# Patient Record
Sex: Male | Born: 1980 | Race: Black or African American | Hispanic: No | Marital: Married | Smoking: Never smoker
Health system: Southern US, Community
[De-identification: ages and names within clinical notes are randomized; demographics above are authoritative.]

---

## 2008-01-11 ENCOUNTER — Ambulatory Visit: Payer: Self-pay | Admitting: Family Medicine

## 2008-01-11 DIAGNOSIS — L259 Unspecified contact dermatitis, unspecified cause: Secondary | ICD-10-CM

## 2008-01-11 DIAGNOSIS — B372 Candidiasis of skin and nail: Secondary | ICD-10-CM

## 2008-01-11 DIAGNOSIS — R21 Rash and other nonspecific skin eruption: Secondary | ICD-10-CM | POA: Insufficient documentation

## 2009-11-19 ENCOUNTER — Ambulatory Visit: Payer: Self-pay | Admitting: Family Medicine

## 2009-11-19 DIAGNOSIS — K921 Melena: Secondary | ICD-10-CM | POA: Insufficient documentation

## 2009-11-19 DIAGNOSIS — K219 Gastro-esophageal reflux disease without esophagitis: Secondary | ICD-10-CM

## 2010-02-11 NOTE — Assessment & Plan Note (Signed)
Summary: BLOOD IN STOOL/TJ Room 5   Vital Signs:  Patient Profile:   30 Years Old Male CC:      Bloody stools x 4 days, Diarrhea on Sunday and Monday, Lighter today and constipation Height:     73 inches Weight:      225 pounds O2 Sat:      99 % O2 treatment:    Room Air Temp:     98.5 degrees F oral Pulse rate:   76 / minute Pulse rhythm:   regular Resp:     12  per minute BP sitting:   136 / 84  (left arm) Cuff size:   regular  Vitals Entered By: Emilio Math (November 19, 2009 1:13 PM)                  Current Allergies: ! * POLLENHistory of Present Illness Chief Complaint: Bloody stools x 4 days, Diarrhea on Sunday and Monday, Lighter today and constipation History of Present Illness:  Subjective:  Patient reports that 4 days ago he had a typical bout of mild heartburn which was relieved by two Zantac 75 tabs.  He states that he has occasional indigestion, but does not take Zantac regularly. The next morning he had a normal bowel movement that was followed by a small amount of bright red blood.  There was no associated rectal/anal pain, and no abdominal pain.  The next day his bowel movements were more loose, mixed with blood and mucous.  Yesterday, his bowel movements were more normal, but he still noticed a very light coating of blood.  Today, he had a normal bowel movement, without blood, but he had to strain slightly.  No melena. He states that until the past 4 days, he had had no suspicious changes in his bowel movements.  He has felt well with good appetite and no weight loss.  No recent changes in his diet. Past history negative. No family history of bowel disorders.  REVIEW OF SYSTEMS Constitutional Symptoms      Denies fever, chills, night sweats, weight loss, weight gain, and fatigue.  Eyes       Denies change in vision, eye pain, eye discharge, glasses, contact lenses, and eye surgery. Ear/Nose/Throat/Mouth       Denies hearing loss/aids, change in hearing,  ear pain, ear discharge, dizziness, frequent runny nose, frequent nose bleeds, sinus problems, sore throat, hoarseness, and tooth pain or bleeding.  Respiratory       Denies dry cough, productive cough, wheezing, shortness of breath, asthma, bronchitis, and emphysema/COPD.  Cardiovascular       Denies murmurs, chest pain, and tires easily with exhertion.    Gastrointestinal       Complains of stomach pain, diarrhea, constipation, and blood in bowel movements.      Denies nausea/vomiting and indigestion. Genitourniary       Denies painful urination, kidney stones, and loss of urinary control. Neurological       Denies paralysis, seizures, and fainting/blackouts. Musculoskeletal       Denies muscle pain, joint pain, joint stiffness, decreased range of motion, redness, swelling, muscle weakness, and gout.  Skin       Denies bruising, unusual mles/lumps or sores, and hair/skin or nail changes.  Psych       Denies mood changes, temper/anger issues, anxiety/stress, speech problems, depression, and sleep problems.  Past History:  Past Medical History: Reviewed history from 01/11/2008 and no changes required. Unremarkable  Past Surgical History: Reviewed history  from 01/11/2008 and no changes required. Denies surgical history  Family History: Reviewed history from 01/11/2008 and no changes required. Mother, Healthy Sister, Healthy  Social History: Reviewed history from 01/11/2008 and no changes required. Non-smoker Yes-Etoh No Drugs [employer]   Objective:  Appearance:  Patient appears healthy, stated age, and in no acute distress  Eyes:  Pupils are equal, round, and reactive to light and accomdation.  Extraocular movement is intact.  Conjunctivae are not inflamed.  Mouth/pharynx:  Normal Neck:  Supple.  No adenopathy is present.  No thyromegaly is present Lungs:  Clear to auscultation.  Breath sounds are equal.  Heart:  Regular rate and rhythm without murmurs, rubs, or  gallops.  Abdomen:   There is some very mild peri-umbilical tenderness without masses or hepatosplenomegaly.  Bowel sounds are present.  No CVA or flank tenderness.  Rectal Exam:  Anus is normal without surrounding erythema.  Small nontender external hemorrhoid present.  No lesions are palpated in the rectal vault, although small internal hemorrhoid palpated.  Stool is heme negative.  Prostate is slightly enlarged but symmetric without tenderness or nodules.  Assessment New Problems: Hx of ESOPHAGEAL REFLUX (ICD-530.81) HEMATOCHEZIA (ICD-578.1)   Plan New Orders: Hemoccult Guaiac-1 spec.(in office) [82270] Est. Patient Level IV [32440] Planning Comments:   Will refer to gastroenterologist for further evaluation. Return for follow-up if develops abdominal pain, nausea/vomiting, fever, etc.   The patient and/or caregiver has been counseled thoroughly with regard to medications prescribed including dosage, schedule, interactions, rationale for use, and possible side effects and they verbalize understanding.  Diagnoses and expected course of recovery discussed and will return if not improved as expected or if the condition worsens. Patient and/or caregiver verbalized understanding.   Orders Added: 1)  Hemoccult Guaiac-1 spec.(in office) [82270] 2)  Est. Patient Level IV [10272]

## 2010-02-11 NOTE — Assessment & Plan Note (Signed)
Summary: Referral GI  Refer to [facility]. Donna Christen MD  November 19, 2009 1:57 PM   PT TO SEE DR LONG NOV 29TH 2:15PM

## 2010-02-11 NOTE — Letter (Signed)
Summary: RECORDS RELEASE  RECORDS RELEASE   Imported By: Dannette Barbara 11/19/2009 16:35:11  _____________________________________________________________________  External Attachment:    Type:   Image     Comment:   External Document

## 2010-03-03 ENCOUNTER — Encounter: Payer: Self-pay | Admitting: Family Medicine

## 2010-03-11 NOTE — Letter (Signed)
Summary: [facility] [place]  [facility]    Imported By: Dannette Barbara 03/03/2010 08:28:48  _____________________________________________________________________  External Attachment:    Type:   Image     Comment:   External Document

## 2010-05-18 ENCOUNTER — Encounter: Payer: Self-pay | Admitting: Emergency Medicine

## 2010-05-18 ENCOUNTER — Inpatient Hospital Stay (INDEPENDENT_AMBULATORY_CARE_PROVIDER_SITE_OTHER)
Admission: RE | Admit: 2010-05-18 | Discharge: 2010-05-18 | Disposition: A | Discharge status: Home / Self Care | Payer: BLUE CROSS/BLUE SHIELD | Source: Ambulatory Visit | Attending: Emergency Medicine | Admitting: Emergency Medicine

## 2010-05-18 DIAGNOSIS — J309 Allergic rhinitis, unspecified: Secondary | ICD-10-CM

## 2010-07-09 ENCOUNTER — Encounter: Payer: Self-pay | Admitting: Family Medicine

## 2010-07-09 ENCOUNTER — Inpatient Hospital Stay (INDEPENDENT_AMBULATORY_CARE_PROVIDER_SITE_OTHER)
Admission: RE | Admit: 2010-07-09 | Discharge: 2010-07-09 | Disposition: A | Discharge status: Home / Self Care | Payer: BLUE CROSS/BLUE SHIELD | Source: Ambulatory Visit | Attending: Family Medicine | Admitting: Family Medicine

## 2010-07-09 DIAGNOSIS — L740 Miliaria rubra: Secondary | ICD-10-CM

## 2010-07-10 ENCOUNTER — Telehealth (INDEPENDENT_AMBULATORY_CARE_PROVIDER_SITE_OTHER): Payer: Self-pay | Admitting: Emergency Medicine

## 2010-08-06 ENCOUNTER — Encounter: Payer: Self-pay | Admitting: Family Medicine

## 2010-08-06 ENCOUNTER — Inpatient Hospital Stay (INDEPENDENT_AMBULATORY_CARE_PROVIDER_SITE_OTHER)
Admission: RE | Admit: 2010-08-06 | Discharge: 2010-08-06 | Disposition: A | Discharge status: Home / Self Care | Payer: BLUE CROSS/BLUE SHIELD | Source: Ambulatory Visit | Attending: Family Medicine | Admitting: Family Medicine

## 2010-08-06 DIAGNOSIS — R109 Unspecified abdominal pain: Secondary | ICD-10-CM

## 2010-08-06 DIAGNOSIS — R300 Dysuria: Secondary | ICD-10-CM

## 2010-08-06 LAB — CONVERTED CEMR LAB
Ketones, urine, test strip: NEGATIVE
Protein, U semiquant: NEGATIVE
WBC Urine, dipstick: NEGATIVE
pH: 5.5

## 2010-08-07 ENCOUNTER — Encounter: Payer: Self-pay | Admitting: Family Medicine

## 2010-12-15 NOTE — Progress Notes (Signed)
Summary: Followup Call  Followup call to patient.  Discussed negative GC/chlamydia results. Recommend Ibuprofen and apply ice pack to symphysis pubis.  If pain above the area continues or increases, consider CT scan  Donna Christen MD  August 07, 2010 1:50 PM

## 2010-12-15 NOTE — Progress Notes (Signed)
Summary: PAIN IN LET TESTICLE (rm 2)   Vital Signs:  Patient Profile:   30 Years Old Male CC:      left testicular pain and dysuria x 1 month Height:     73 inches Weight:      214 pounds O2 Sat:      99 % O2 treatment:    Room Air Temp:     98.8 degrees F oral Pulse rate:   83 / minute Resp:     18 per minute BP sitting:   121 / 78  (left arm) Cuff size:   large  Vitals Entered By: Lajean Saver RN (August 06, 2010 3:23 PM)                  Updated Prior Medication List: ZYRTEC ALLERGY 10 MG TABS (CETIRIZINE HCL) prn  Current Allergies (reviewed today): ! * POLLENHistory of Present Illness Chief Complaint: left testicular pain and dysuria x 1 month History of Present Illness:  Subjective:  Patient complains of a long history of vague mild discomfort in his left inguinal area and left testicle, most noticeable when he leans against something.  The discomfort has increased somewhat over the past 3 to 4 days.  No dysuria, frequency, hesitancy, nocturia, or urethral discharge.  No fevers, chills, and sweats.  No back pain.  No abdominal pain.  No nausea or vomiting.  He states that he has begun working out recently, including  abdominal crunches and planks.  REVIEW OF SYSTEMS Constitutional Symptoms      Denies fever, chills, night sweats, weight loss, weight gain, and fatigue.  Eyes       Denies change in vision, eye pain, eye discharge, glasses, contact lenses, and eye surgery. Ear/Nose/Throat/Mouth       Denies hearing loss/aids, change in hearing, ear pain, ear discharge, dizziness, frequent runny nose, frequent nose bleeds, sinus problems, sore throat, hoarseness, and tooth pain or bleeding.  Respiratory       Denies dry cough, productive cough, wheezing, shortness of breath, asthma, bronchitis, and emphysema/COPD.  Cardiovascular       Denies murmurs, chest pain, and tires easily with exhertion.    Gastrointestinal       Complains of stomach pain.      Denies  nausea/vomiting, diarrhea, constipation, blood in bowel movements, and indigestion.      Comments: lower left Genitourniary       Complains of painful urination.      Denies kidney stones and loss of urinary control. Neurological       Denies paralysis, seizures, and fainting/blackouts. Musculoskeletal       Denies muscle pain, joint pain, joint stiffness, decreased range of motion, redness, swelling, muscle weakness, and gout.  Skin       Denies bruising, unusual mles/lumps or sores, and hair/skin or nail changes.  Psych       Denies mood changes, temper/anger issues, anxiety/stress, speech problems, depression, and sleep problems. Other Comments: Patinet c/o dysuria at times, left testicular pain with sitting, and lower left abdominal pain x 1 month   Past History:  Past Medical History: Reviewed history from 01/11/2008 and no changes required. Unremarkable  Past Surgical History: Reviewed history from 01/11/2008 and no changes required. Denies surgical history  Family History: Reviewed history from 01/11/2008 and no changes required. Mother, Healthy Sister, Healthy  Social History: Non-smoker Yes-Etoh No Drugs [employer] Married   Objective:  Appearance:  Patient appears healthy, stated age, and in no acute  distress  Lungs:  Clear to auscultation.  Breath sounds are equal.  Heart:  Regular rate and rhythm without murmurs, rubs, or gallops.  Abdomen:  No masses or hepatosplenomegaly.  Mild tenderness in the left inguinal region more noticeable with valsalva.  No lumps or masses palpated, however.  Bowel sounds are present.  No CVA or flank tenderness.  There is distinct tenderness over the symphysis pubis, worse with resisted adduction of the hips. Genitourinary:  Penis normal without lesions or urethral discharge.  Scrotum is normal.  Testes are descended bilaterally without nodules or tenderness.  No hernias are palpated.  No regional lymphadenopathy palpated.     urinalysis (dipstick):  negative Assessment New Problems: GROIN PAIN (ICD-789.09) DYSURIA (ICD-788.1)  OSTEITIS PUBIS POSSIBLE SMALL LEFT DIRECT INGUINAL HERNIA, BUT DOUBT  Plan New Orders: T-Chlamydia & GC Probe, Urine [[id number]] Urinalysis [CPT-81003] Est. Patient Level IV [16109] Planning Comments:   Will send urine specimen for GC/chlamydia Recommend applying ice pack to symphysis pubis several times daily.  Begin Ibuprofen 200mg , 4 tabs every 8 hours with food for about 4-5 days.  [facility] information and instruction patient handout given on osteitis pubis.  Recommend decreasing abdominal crunches and sit-ups, etc until improved.  Recommend follow-up with [facility] clinic if not improving. If pain worsens in left inguinal area, CT scan would be most useful to evaluate for direct hernia. As a precaution, will send urine specimen for GC/chlamydia.   The patient and/or caregiver has been counseled thoroughly with regard to medications prescribed including dosage, schedule, interactions, rationale for use, and possible side effects and they verbalize understanding.  Diagnoses and expected course of recovery discussed and will return if not improved as expected or if the condition worsens. Patient and/or caregiver verbalized understanding.   Orders Added: 1)  T-Chlamydia & GC Probe, Urine [[id number]] 2)  Urinalysis [CPT-81003] 3)  Est. Patient Level IV [60454]    Laboratory Results   Urine Tests  Date/Time Received: August 06, 2010 3:33 PM  Date/Time Reported: August 06, 2010 3:33 PM   Routine Urinalysis   Color: yellow Appearance: Clear Glucose: negative   (Normal Range: Negative) Bilirubin: negative   (Normal Range: Negative) Ketone: negative   (Normal Range: Negative) Spec. Gravity: 1.015   (Normal Range: 1.003-1.035) Blood: trace-lysed   (Normal Range: Negative) pH: 5.5   (Normal Range: 5.0-8.0) Protein: negative   (Normal Range:  Negative) Urobilinogen: 0.2   (Normal Range: 0-1) Nitrite: negative   (Normal Range: Negative) Leukocyte Esterace: negative   (Normal Range: Negative)

## 2010-12-15 NOTE — Progress Notes (Signed)
Summary: ALLERGIES/WSE(rm2)   Vital Signs:  Patient Profile:   30 Years Old Male CC:      allergies Height:     73 inches Weight:      215 pounds O2 Sat:      96 % O2 treatment:    64Room Air Temp:     98.6 degrees F oral Resp:     18 per minute BP sitting:   126 / 84  (left arm) Cuff size:   large  Vitals Entered By: Linton Flemings RN (May 18, 2010 12:11 PM)                  Updated Prior Medication List: ZYRTEC ALLERGY 10 MG TABS (CETIRIZINE HCL) prn ZANTAC 150 MG TABS (RANITIDINE HCL)  ASTELIN 137 MCG/SPRAY SOLN (AZELASTINE HCL) 1-2 sprays per nostril BID  Current Allergies: ! * POLLENHistory of Present Illness Chief Complaint: allergies History of Present Illness: 30 Years Old Male complains of onset of cold symptoms for a few days.  Peter Rodriguez has been using Zyrtec which is helping a little bit.  He has been spending more time outside lately.  He recently ran out of his Astelin. No sore throat +/- cough No pleuritic pain No wheezing + nasal congestion + post-nasal drainage + sinus pain/pressure No chest congestion + itchy/red eyes No earache No hemoptysis No SOB No chills/sweats No fever No nausea No vomiting No abdominal pain No diarrhea No skin rashes No fatigue No myalgias No headache   REVIEW OF SYSTEMS Constitutional Symptoms      Denies fever, chills, night sweats, weight loss, weight gain, and fatigue.  Eyes       Denies change in vision, eye pain, eye discharge, glasses, contact lenses, and eye surgery. Ear/Nose/Throat/Mouth       Complains of frequent runny nose and sinus problems.      Denies hearing loss/aids, change in hearing, ear pain, ear discharge, dizziness, frequent nose bleeds, sore throat, hoarseness, and tooth pain or bleeding.  Respiratory       Denies dry cough, productive cough, wheezing, shortness of breath, asthma, bronchitis, and emphysema/COPD.  Cardiovascular       Denies murmurs, chest pain, and tires easily with  exhertion.    Gastrointestinal       Denies stomach pain, nausea/vomiting, diarrhea, constipation, blood in bowel movements, and indigestion. Genitourniary       Denies painful urination, kidney stones, and loss of urinary control. Neurological       Denies paralysis, seizures, and fainting/blackouts. Musculoskeletal       Denies muscle pain, joint pain, joint stiffness, decreased range of motion, redness, swelling, muscle weakness, and gout.  Skin       Denies bruising, unusual mles/lumps or sores, and hair/skin or nail changes.  Psych       Denies mood changes, temper/anger issues, anxiety/stress, speech problems, depression, and sleep problems.  Past History:  Past Medical History: Reviewed history from 01/11/2008 and no changes required. Unremarkable  Past Surgical History: Reviewed history from 01/11/2008 and no changes required. Denies surgical history  Family History: Reviewed history from 01/11/2008 and no changes required. Mother, Healthy Sister, Healthy  Social History: Reviewed history from 01/11/2008 and no changes required. Non-smoker Yes-Etoh No Drugs [employer] Physical Exam General appearance: well developed, well nourished, no acute distress Nasal: clear discharge Oral/Pharynx: clear PND, no erythema, no exudates Neck: neck supple,  trachea midline, no masses Chest/Lungs: no rales, wheezes, or rhonchi bilateral, breath sounds equal without  effort Heart: regular rate and  rhythm, no murmur MSE: oriented to time, place, and person Assessment New Problems: ALLERGIC RHINITIS (ICD-477.9)   Plan New Medications/Changes: ASTELIN 137 MCG/SPRAY SOLN (AZELASTINE HCL) 1-2 sprays per nostril BID  #1 x 5, 05/18/2010, Hoyt Koch MD  New Orders: Est. Patient Level III [phone] Pulse Oximetry (single measurment) [94760] Services provided After hours-Weekends-Holidays [99051] Planning Comments:   No antibiotic given today since likely allergic.   Gave Rx for Astelin.  Use Zyrtec but add Sudafed for a few days. Use nasal saline solution (over the counter) at least 3 times a day. Follow up with your primary doctor  if no improvement in 5-7 days, sooner if increasing pain, fever, or new symptoms.  If his symptoms get worse, fever, facial pain... patient to call back in 5-7 days and we'll call in Zpak.   The patient and/or caregiver has been counseled thoroughly with regard to medications prescribed including dosage, schedule, interactions, rationale for use, and possible side effects and they verbalize understanding.  Diagnoses and expected course of recovery discussed and will return if not improved as expected or if the condition worsens. Patient and/or caregiver verbalized understanding.  Prescriptions: ASTELIN 137 MCG/SPRAY SOLN (AZELASTINE HCL) 1-2 sprays per nostril BID  #1 x 5   Entered and Authorized by:   Hoyt Koch MD   Signed by:   Hoyt Koch MD on 05/18/2010   Method used:   Print then Give to Patient   RxID:   [id number]   Orders Added: 1)  Est. Patient Level III [86578] 2)  Pulse Oximetry (single measurment) [94760] 3)  Services provided After hours-Weekends-Holidays [99051]

## 2010-12-15 NOTE — Letter (Signed)
Summary: Out of Work  [facility]  [address]   [place], [place] [address]   Phone: [phone]  Fax: [phone]    July 09, 2010   Employee:  [employer]    To Whom It May Concern:   For Medical reasons, please excuse the above named employee from work today and tomorrow.   If you need additional information, please feel free to contact our office.         Sincerely,    Donna Christen MD  Appended Document: Out of Work Above letter written in error.

## 2010-12-15 NOTE — Telephone Encounter (Signed)
  Phone Note Outgoing Call Call back at [facility] Phone [phone] [facility]     Call placed by: Emilio Math,  July 10, 2010 2:04 PM Call placed to: Patient Summary of Call: Left message hope rash is resolving call with any questions or concerns

## 2010-12-15 NOTE — Progress Notes (Signed)
Summary: HEAT RASH ON BOTH ARMS Room 4   Vital Signs:  Patient Profile:   30 Years Old Male CC:      Rash on both arms x 2 wkks after sitting in the sun Height:     73 inches Weight:      213 pounds O2 Sat:      99 % O2 treatment:    Room Air Temp:     98.0 degrees F oral Pulse rate:   79 / minute Pulse rhythm:   regular Resp:     12 per minute BP sitting:   132 / 81  (left arm) Cuff size:   regular  Vitals Entered By: Emilio Math (July 09, 2010 1:21 PM)                  Current Allergies (reviewed today): ! * POLLENHistory of Present Illness Chief Complaint: Rash on both arms x 2 wkks after sitting in the sun History of Present Illness:  Subjective:  Patient complains of onset of pruritic rash on both forearms two weeks ago after he had been sitting in sun at a swimming pool.  The rash consisted of numerous tiny blisters, and improved after applying 1% HC cream.  Three days ago after spending several hours in the sun washing cars, he developed a similar pruritic eruption on his forearms, now improved.    Current Meds ZYRTEC ALLERGY 10 MG TABS (CETIRIZINE HCL) prn CORTIZONE-10 1 % OINT (HYDROCORTISONE)  TRIAMCINOLONE ACETONIDE 0.1 % LOTN (TRIAMCINOLONE ACETONIDE) Apply thin film to affected area once or twice daily  REVIEW OF SYSTEMS Constitutional Symptoms      Denies fever, chills, night sweats, weight loss, weight gain, and fatigue.  Eyes       Denies change in vision, eye pain, eye discharge, glasses, contact lenses, and eye surgery. Ear/Nose/Throat/Mouth       Denies hearing loss/aids, change in hearing, ear pain, ear discharge, dizziness, frequent runny nose, frequent nose bleeds, sinus problems, sore throat, hoarseness, and tooth pain or bleeding.  Respiratory       Denies dry cough, productive cough, wheezing, shortness of breath, asthma, bronchitis, and emphysema/COPD.  Cardiovascular       Denies murmurs, chest pain, and tires easily with exhertion.     Gastrointestinal       Denies stomach pain, nausea/vomiting, diarrhea, constipation, blood in bowel movements, and indigestion. Genitourniary       Denies painful urination, kidney stones, and loss of urinary control. Neurological       Denies paralysis, seizures, and fainting/blackouts. Musculoskeletal       Denies muscle pain, joint pain, joint stiffness, decreased range of motion, redness, swelling, muscle weakness, and gout.  Skin       Denies bruising, unusual mles/lumps or sores, and hair/skin or nail changes.  Psych       Denies mood changes, temper/anger issues, anxiety/stress, speech problems, depression, and sleep problems.  Past History:  Past Medical History: Reviewed history from 01/11/2008 and no changes required. Unremarkable  Past Surgical History: Reviewed history from 01/11/2008 and no changes required. Denies surgical history  Family History: Reviewed history from 01/11/2008 and no changes required. Mother, Healthy Sister, Healthy  Social History: Reviewed history from 01/11/2008 and no changes required. Non-smoker Yes-Etoh No Drugs [employer]   Objective:  Appearance:  Patient appears healthy, stated age, and in no acute distress  Skin:  On both dorsal forearms the skin is slightly hyperkeratotic but no distinct dermatitis is present.  Assessment New Problems: HEAT RASH (ICD-705.1)  SUSPECT HEAT RASH FROM SUN EXPOSURE  Plan New Medications/Changes: TRIAMCINOLONE ACETONIDE 0.1 % LOTN (TRIAMCINOLONE ACETONIDE) Apply thin film to affected area once or twice daily  #60cc x 0, 07/09/2010, Donna Christen MD  New Orders: Est. Patient Level III [phone] Planning Comments:   Recommend wearing long sleeves when in sun for prolonged period.  Use calamine lotion for itching.  Rx for triamcinolone lotion for more severe rash.  [facility] info sheet given on heat rash.   The patient and/or caregiver has been counseled thoroughly with regard to  medications prescribed including dosage, schedule, interactions, rationale for use, and possible side effects and they verbalize understanding.  Diagnoses and expected course of recovery discussed and will return if not improved as expected or if the condition worsens. Patient and/or caregiver verbalized understanding.  Prescriptions: TRIAMCINOLONE ACETONIDE 0.1 % LOTN (TRIAMCINOLONE ACETONIDE) Apply thin film to affected area once or twice daily  #60cc x 0   Entered and Authorized by:   Donna Christen MD   Signed by:   Donna Christen MD on 07/09/2010   Method used:   Print then Give to Patient   RxID:   [id number]   Orders Added: 1)  Est. Patient Level III [47425]

## 2012-10-22 ENCOUNTER — Encounter: Payer: Self-pay | Admitting: Emergency Medicine

## 2012-10-22 ENCOUNTER — Emergency Department (INDEPENDENT_AMBULATORY_CARE_PROVIDER_SITE_OTHER)
Admission: EM | Admit: 2012-10-22 | Discharge: 2012-10-22 | Disposition: A | Discharge status: Home / Self Care | Payer: 59 | Source: Home / Self Care | Attending: Family Medicine | Admitting: Family Medicine

## 2012-10-22 ENCOUNTER — Emergency Department (INDEPENDENT_AMBULATORY_CARE_PROVIDER_SITE_OTHER): Payer: 59

## 2012-10-22 DIAGNOSIS — M5430 Sciatica, unspecified side: Secondary | ICD-10-CM

## 2012-10-22 DIAGNOSIS — M5431 Sciatica, right side: Secondary | ICD-10-CM

## 2012-10-22 DIAGNOSIS — M5137 Other intervertebral disc degeneration, lumbosacral region: Secondary | ICD-10-CM

## 2012-10-22 DIAGNOSIS — M4310 Spondylolisthesis, site unspecified: Secondary | ICD-10-CM

## 2012-10-22 MED ORDER — PREDNISONE 50 MG PO TABS: ORAL_TABLET | ORAL | Status: DC

## 2012-10-22 MED ORDER — CYCLOBENZAPRINE HCL 10 MG PO TABS: 10.0000 mg | ORAL_TABLET | Freq: Three times a day (TID) | ORAL | Status: DC | PRN

## 2012-10-22 NOTE — ED Notes (Signed)
Reports jumping and stretching overhead 3 days ago; awoke with numbness/pain/tingling in right foot which radiated up leg, into buttocks and lower back. Has used Aleve without relief. Has had back sprains in past, but never required medical care.

## 2012-10-22 NOTE — ED Provider Notes (Signed)
CSN: [id number]     Arrival date & time 10/22/12  1412 History   First MD Initiated Contact with Patient 10/22/12 1415     Chief Complaint  Patient presents with  . Back Pain    HPI  The patient presents today with back pain. Location: Lumbar spine, R sided  Timing: intermittnent  Description: Was jumping with daughter last week, felt a mild back strain. Woke up next am with with LBP and burning radiating down into R leg and R foot.  Worse with: movement  Better with: rest  Trauma: jumping  Bladder/bowel incontinence: n Weakness: no Fever/chills: no Night pain:no Unexplained weight loss: no Cancer/immunosuppression: no PMH of osteoporosis or chronic steroid use:  no   History reviewed. No pertinent past medical history. History reviewed. No pertinent past surgical history. History reviewed. No pertinent family history. History  Substance Use Topics  . Smoking status: Never Smoker   . Smokeless tobacco: Not on file  . Alcohol Use: Yes    Review of Systems  All other systems reviewed and are negative.    Allergies  Review of patient's allergies indicates no known allergies.  Home Medications   Current Outpatient Rx  Name  Route  Sig  Dispense  Refill  . cetirizine (ZYRTEC) 10 MG tablet   Oral   Take 10 mg by mouth daily.         . cyclobenzaprine (FLEXERIL) 10 MG tablet   Oral   Take 1 tablet (10 mg total) by mouth 3 (three) times daily as needed for muscle spasms.   30 tablet   0   . predniSONE (DELTASONE) 50 MG tablet      1 tab daily x 5 days   7 tablet   0    BP 116/78  Temp(Src) 97.8 F (36.6 C) (Oral)  Resp 16  Ht 6' (1.829 m)  Wt 204 lb (92.534 kg)  BMI 27.66 kg/m2  SpO2 98% Physical Exam  Constitutional: He appears well-developed and well-nourished.  HENT:  Head: Normocephalic and atraumatic.  Eyes: Conjunctivae are normal. Pupils are equal, round, and reactive to light.  Neck: Normal range of motion. Neck supple.  Cardiovascular:  Normal rate and regular rhythm.   Pulmonary/Chest: Effort normal and breath sounds normal.  Abdominal: Soft.  Musculoskeletal:       Arms: Neurological: He is alert.  Skin: Skin is warm.    ED Course  Procedures (including critical care time) Labs Review Labs Reviewed - No data to display Imaging Review Dg Lumbar Spine Complete  10/22/2012   CLINICAL DATA:  32 year old male with low back injury and pain.  EXAM: LUMBAR SPINE - COMPLETE 4+ VIEW  COMPARISON:  None.  FINDINGS: Five non rib-bearing lumbar type vertebra are identified.  5 mm retrolisthesis of L5 on S1 is noted with mild degenerative disc disease at this level.  There is no evidence of acute fracture.  The other lumbar disk spaces are maintained.  No focal bony lesions or spondylolysis noted.  IMPRESSION: Mild degenerative disc disease and 5 mm spondylolisthesis at L5-S1.  No evidence of acute fracture.   Electronically Signed   By: Laveda Abbe M.D.   On: 10/22/2012 15:25      MDM   1. Sciatica, right    Exam consistent with sciatica  L4-L5 vs L5-S1 distribution given exam. Xray somewhat correlates this.  Prednisone for acute treatment.  RICE Discussed general and MSK red flags.  Follow up with sports medicine.  The patient and/or caregiver has been counseled thoroughly with regard to treatment plan and/or medications prescribed including dosage, schedule, interactions, rationale for use, and possible side effects and they verbalize understanding. Diagnoses and expected course of recovery discussed and will return if not improved as expected or if the condition worsens. Patient and/or caregiver verbalized understanding.         Doree Albee, MD 10/22/12 [phone]

## 2012-11-04 ENCOUNTER — Ambulatory Visit (INDEPENDENT_AMBULATORY_CARE_PROVIDER_SITE_OTHER): Payer: 59 | Admitting: Sports Medicine

## 2012-11-04 ENCOUNTER — Encounter: Payer: Self-pay | Admitting: Sports Medicine

## 2012-11-04 VITALS — BP 131/79 | HR 87 | Wt 211.0 lb

## 2012-11-04 DIAGNOSIS — M5416 Radiculopathy, lumbar region: Secondary | ICD-10-CM | POA: Insufficient documentation

## 2012-11-04 DIAGNOSIS — IMO0002 Reserved for concepts with insufficient information to code with codable children: Secondary | ICD-10-CM

## 2012-11-04 MED ORDER — MELOXICAM 15 MG PO TABS: ORAL_TABLET | ORAL | Status: DC

## 2012-11-04 NOTE — Progress Notes (Signed)
  Subjective:    CC: Back pain  HPI: This is a pleasant 32 year old male who presents with three weeks of right-sided low back pain that began while he was jumping. The pain is moderate and persistent. It is worse with prolonged sitting and improves when he stands up. It does not impair his sleep. He describes a burning pain that radiates down into the buttock and down the back of the leg to the lateral foot. He was seen in urgent care two weeks ago and prescribed flexeril and a 5-day course of prednisone. The pain improved slightly with prednisone but he had many headaches. He denies any loss of bowel or bladder function or muscle weakness.  Past medical history, Surgical history, Family history not pertinant except as noted below, Social history, Allergies, and medications have been entered into the medical record, reviewed, and no changes needed.   Review of Systems: No headache, visual changes, nausea, vomiting, diarrhea, constipation, dizziness, abdominal pain, skin rash, fevers, chills, night sweats, swollen lymph nodes, weight loss, chest pain, body aches, joint swelling, muscle aches, shortness of breath, mood changes, visual or auditory hallucinations.  Objective:    General: Well Developed, well nourished, and in no acute distress.  Neuro: Alert and oriented x3, extra-ocular muscles intact, sensation grossly intact.  HEENT: Normocephalic, atraumatic, pupils equal round reactive to light, neck supple, no masses, no lymphadenopathy, thyroid nonpalpable.  Skin: Warm and dry, no rashes noted.  Cardiac: Regular rate and rhythm, no murmurs rubs or gallops.  Respiratory: Clear to auscultation bilaterally. Not using accessory muscles, speaking in full sentences.  Abdominal: Soft, nontender, nondistended, positive bowel sounds, no masses, no organomegaly.  Back Exam:  Inspection: Unremarkable  Motion: Flexion 45 deg, Extension 45 deg, Side Bending to 45 deg bilaterally,  Rotation to 45 deg  bilaterally  SLR laying: Positive on R side  XSLR laying: Negative  Palpable tenderness: Tender to palpation to the right of his low lumbar FABER: negative. Sensory change: Gross sensation intact to all lumbar and sacral dermatomes.  Reflexes: 2+ at both patellar tendons, 2+ at L achilles tendon, 1+ at R achilles tendon Strength at foot  Plantar-flexion: 5/5 Dorsi-flexion: 5/5 Eversion: 5/5 Inversion: 5/5  Leg strength  Quad: 5/5 Hamstring: 5/5 Hip flexor: 5/5 Hip abductors: 5/5  Gait unremarkable.  X-rays were personally reviewed and do show mild retrolisthesis of L5 on S1 with degenerative disc changes.  Impression and Recommendations:    The patient was counselled, risk factors were discussed, anticipatory guidance given.  Assessment: This is a 32 year old male whose back and leg pain are likely L5 vs S1 radiculopathy.  Plan:  1. 4 weeks of formal physical therapy 2. Meloxicam as needed for pain 3. Return for follow-up in 4 weeks  This note was originally written by Karin Lieu MS3.

## 2012-11-04 NOTE — Assessment & Plan Note (Signed)
Mobic, PT, return in one month, MRI for interventional injection planning if no better.

## 2012-11-16 ENCOUNTER — Ambulatory Visit: Payer: 59 | Admitting: Physical Therapy

## 2012-11-16 DIAGNOSIS — M6281 Muscle weakness (generalized): Secondary | ICD-10-CM

## 2012-11-16 DIAGNOSIS — M545 Low back pain: Secondary | ICD-10-CM

## 2012-11-16 DIAGNOSIS — IMO0002 Reserved for concepts with insufficient information to code with codable children: Secondary | ICD-10-CM

## 2012-11-16 DIAGNOSIS — M2560 Stiffness of unspecified joint, not elsewhere classified: Secondary | ICD-10-CM

## 2012-11-23 ENCOUNTER — Encounter: Payer: 59 | Admitting: Physical Therapy

## 2012-11-23 DIAGNOSIS — IMO0002 Reserved for concepts with insufficient information to code with codable children: Secondary | ICD-10-CM

## 2012-11-23 DIAGNOSIS — M6281 Muscle weakness (generalized): Secondary | ICD-10-CM

## 2012-11-23 DIAGNOSIS — M2560 Stiffness of unspecified joint, not elsewhere classified: Secondary | ICD-10-CM

## 2012-11-23 DIAGNOSIS — M545 Low back pain: Secondary | ICD-10-CM

## 2012-11-25 ENCOUNTER — Encounter: Payer: 59 | Admitting: Physical Therapy

## 2012-11-25 DIAGNOSIS — M6281 Muscle weakness (generalized): Secondary | ICD-10-CM

## 2012-11-25 DIAGNOSIS — M2560 Stiffness of unspecified joint, not elsewhere classified: Secondary | ICD-10-CM

## 2012-11-25 DIAGNOSIS — IMO0002 Reserved for concepts with insufficient information to code with codable children: Secondary | ICD-10-CM

## 2012-11-28 ENCOUNTER — Encounter: Payer: 59 | Admitting: Physical Therapy

## 2012-11-28 DIAGNOSIS — M545 Low back pain: Secondary | ICD-10-CM

## 2012-11-28 DIAGNOSIS — M2560 Stiffness of unspecified joint, not elsewhere classified: Secondary | ICD-10-CM

## 2012-11-28 DIAGNOSIS — M6281 Muscle weakness (generalized): Secondary | ICD-10-CM

## 2012-11-28 DIAGNOSIS — IMO0002 Reserved for concepts with insufficient information to code with codable children: Secondary | ICD-10-CM

## 2012-11-30 ENCOUNTER — Encounter: Payer: 59 | Admitting: Physical Therapy

## 2012-11-30 DIAGNOSIS — M6281 Muscle weakness (generalized): Secondary | ICD-10-CM

## 2012-11-30 DIAGNOSIS — IMO0002 Reserved for concepts with insufficient information to code with codable children: Secondary | ICD-10-CM

## 2012-12-05 ENCOUNTER — Encounter: Payer: 59 | Admitting: Physical Therapy

## 2012-12-07 ENCOUNTER — Telehealth: Payer: Self-pay | Admitting: *Deleted

## 2012-12-07 ENCOUNTER — Encounter: Payer: Self-pay | Admitting: Sports Medicine

## 2012-12-07 ENCOUNTER — Ambulatory Visit (INDEPENDENT_AMBULATORY_CARE_PROVIDER_SITE_OTHER): Payer: 59 | Admitting: Sports Medicine

## 2012-12-07 VITALS — BP 125/76 | HR 73 | Wt 209.0 lb

## 2012-12-07 DIAGNOSIS — M5416 Radiculopathy, lumbar region: Secondary | ICD-10-CM

## 2012-12-07 DIAGNOSIS — IMO0002 Reserved for concepts with insufficient information to code with codable children: Secondary | ICD-10-CM

## 2012-12-07 NOTE — Telephone Encounter (Signed)
PA obtained for MRI Lumbar Spine w/o contrast.  Auth # [id number].  Meyer Cory, LPN

## 2012-12-07 NOTE — Assessment & Plan Note (Signed)
Excellent response to conservative measures. Traction seemed to work the best. At this point he does want to proceed with MRI for interventional injection planning. Like to see him back to go over results of the MRI.

## 2012-12-07 NOTE — Progress Notes (Signed)
Patient ID: Jurell Basista, male   DOB: 01-30-1980, 32 y.o.   MRN: [id number]  Subjective:    CC: Follow up (herniated disc)  HPI: This is a 32 y.o patient who is here for follow up for a herniated disc. Last time he was seen in clinic about a month ago, we started him with conservative methods (Physical Therapy and Mobic). He does report some improvement since starting PT. The patient states that the numbness and tingling sensation on his right lower leg and lateral foot are gone but he still has a pain in his buttock up to his knee. The pain is worse when sitting down for a long time or during long car rides. Our plan last time was to try conservative methods and if the symptoms persist, we will get an MRI in anticipation of epidural. The patient opts for this intervention at this time.   Past medical history, Surgical history, Family history not pertinant except as noted below, Social history, Allergies, and medications have been entered into the medical record, reviewed, and no changes needed.   Review of Systems: No fevers, chills, night sweats, weight loss, chest pain, or shortness of breath.   Objective:    General: Well Developed, well nourished, and in no acute distress.  Neuro: Alert and oriented x3, extra-ocular muscles intact, sensation grossly intact.  HEENT: Normocephalic, atraumatic, pupils equal round reactive to light, neck supple, no masses, no lymphadenopathy, thyroid nonpalpable.  Skin: Warm and dry, no rashes. Cardiac: Regular rate and rhythm, no murmurs rubs or gallops, no lower extremity edema.  Respiratory: Clear to auscultation bilaterally. Not using accessory muscles, speaking in full sentences. Back Exam:  Inspection: Unremarkable  Motion: Flexion 45 deg, Extension 45 deg, Side Bending to 45 deg bilaterally,  Rotation to 45 deg bilaterally  SLR laying: Negative  XSLR laying: Negative  Palpable tenderness: None. FABER: negative. Sensory change: Gross sensation intact  to all lumbar and sacral dermatomes.  Reflexes: 2+ at both patellar tendons, 2+ at achilles tendons, Babinski's downgoing.  Strength at foot  Plantar-flexion: 5/5 Dorsi-flexion: 5/5 Eversion: 5/5 Inversion: 5/5  Leg strength  Quad: 5/5 Hamstring: 5/5 Hip flexor: 5/5 Hip abductors: 5/5  Gait unremarkable.  Impression and Recommendations:   Assessment: This is a 32 y.o patient who is here for follow up for a herniated disc possibly in an L5 vs S1 distribution.  Plan:  -At time, we will schedule an MRI in anticipation of getting an epidural. -The patient will make a follow up appointment to go over MRI results with physician.  Patient encouraged to continue home exercises.

## 2012-12-10 ENCOUNTER — Ambulatory Visit (HOSPITAL_BASED_OUTPATIENT_CLINIC_OR_DEPARTMENT_OTHER)
Admission: RE | Admit: 2012-12-10 | Discharge: 2012-12-10 | Disposition: A | Discharge status: Home / Self Care | Payer: 59 | Source: Ambulatory Visit | Attending: Sports Medicine | Admitting: Sports Medicine

## 2012-12-10 DIAGNOSIS — M545 Low back pain, unspecified: Secondary | ICD-10-CM | POA: Insufficient documentation

## 2012-12-10 DIAGNOSIS — M5137 Other intervertebral disc degeneration, lumbosacral region: Secondary | ICD-10-CM | POA: Insufficient documentation

## 2012-12-10 DIAGNOSIS — M5126 Other intervertebral disc displacement, lumbar region: Secondary | ICD-10-CM | POA: Insufficient documentation

## 2012-12-10 DIAGNOSIS — M51379 Other intervertebral disc degeneration, lumbosacral region without mention of lumbar back pain or lower extremity pain: Secondary | ICD-10-CM | POA: Insufficient documentation

## 2012-12-10 DIAGNOSIS — M47817 Spondylosis without myelopathy or radiculopathy, lumbosacral region: Secondary | ICD-10-CM | POA: Insufficient documentation

## 2012-12-10 DIAGNOSIS — M48061 Spinal stenosis, lumbar region without neurogenic claudication: Secondary | ICD-10-CM | POA: Insufficient documentation

## 2012-12-10 DIAGNOSIS — M79609 Pain in unspecified limb: Secondary | ICD-10-CM | POA: Insufficient documentation

## 2012-12-10 DIAGNOSIS — M5416 Radiculopathy, lumbar region: Secondary | ICD-10-CM

## 2012-12-29 ENCOUNTER — Encounter: Payer: Self-pay | Admitting: Sports Medicine

## 2012-12-29 ENCOUNTER — Ambulatory Visit (INDEPENDENT_AMBULATORY_CARE_PROVIDER_SITE_OTHER): Payer: 59 | Admitting: Sports Medicine

## 2012-12-29 VITALS — BP 130/72 | HR 77 | Wt 205.0 lb

## 2012-12-29 DIAGNOSIS — M5416 Radiculopathy, lumbar region: Secondary | ICD-10-CM

## 2012-12-29 DIAGNOSIS — IMO0002 Reserved for concepts with insufficient information to code with codable children: Secondary | ICD-10-CM

## 2012-12-29 NOTE — Progress Notes (Signed)
  Subjective:    CC: Followup  HPI: Right lumbar radiculitis: Peter Rodriguez has unfortunately failed physical therapy, steroids, NSAIDs, muscle relaxers. He returns with persistent radicular symptoms and right-sided L5 versus an S1 distribution. We recently obtained an MRI and he is here to followup the results.  Past medical history, Surgical history, Family history not pertinant except as noted below, Social history, Allergies, and medications have been entered into the medical record, reviewed, and no changes needed.   Review of Systems: No fevers, chills, night sweats, weight loss, chest pain, or shortness of breath.   Objective:    General: Well Developed, well nourished, and in no acute distress.  Neuro: Alert and oriented x3, extra-ocular muscles intact, sensation grossly intact.  HEENT: Normocephalic, atraumatic, pupils equal round reactive to light, neck supple, no masses, no lymphadenopathy, thyroid nonpalpable.  Skin: Warm and dry, no rashes. Cardiac: Regular rate and rhythm, no murmurs rubs or gallops, no lower extremity edema.  Respiratory: Clear to auscultation bilaterally. Not using accessory muscles, speaking in full sentences.  MRI was personally reviewed and does show a very large description of the right at the L5-S1 level causing fairly severe lateral recess and foraminal stenosis.  Impression and Recommendations:

## 2012-12-29 NOTE — Assessment & Plan Note (Signed)
At this point Peter Rodriguez has failed conservative measures. We are going to proceed with interventional treatment in the form of a right-sided L5-S1 transforaminal epidural steroid injection. Considering the size and isolation of his disc herniation, I would also like him to speak to the spine surgeon. I would like to see Peter Rodriguez back about one week after his injection.

## 2013-01-04 ENCOUNTER — Ambulatory Visit
Admission: RE | Admit: 2013-01-04 | Discharge: 2013-01-04 | Disposition: A | Discharge status: Home / Self Care | Payer: 59 | Source: Ambulatory Visit | Attending: Sports Medicine | Admitting: Sports Medicine

## 2013-01-04 MED ORDER — METHYLPREDNISOLONE ACETATE 40 MG/ML INJ SUSP (RADIOLOG
120.0000 mg | Freq: Once | INTRAMUSCULAR | Status: AC
  Administered 2013-01-04: 120 mg via EPIDURAL

## 2013-01-04 MED ORDER — IOHEXOL 180 MG/ML  SOLN
1.0000 mL | Freq: Once | INTRAMUSCULAR | Status: AC | PRN
  Administered 2013-01-04: 1 mL via EPIDURAL

## 2013-01-11 ENCOUNTER — Ambulatory Visit (INDEPENDENT_AMBULATORY_CARE_PROVIDER_SITE_OTHER): Payer: 59 | Admitting: Sports Medicine

## 2013-01-11 ENCOUNTER — Encounter: Payer: Self-pay | Admitting: Sports Medicine

## 2013-01-11 VITALS — BP 132/80 | HR 81 | Wt 209.0 lb

## 2013-01-11 DIAGNOSIS — IMO0002 Reserved for concepts with insufficient information to code with codable children: Secondary | ICD-10-CM

## 2013-01-11 DIAGNOSIS — M5416 Radiculopathy, lumbar region: Secondary | ICD-10-CM

## 2013-01-11 MED ORDER — GABAPENTIN 300 MG PO CAPS: ORAL_CAPSULE | ORAL | Status: DC

## 2013-01-11 NOTE — Assessment & Plan Note (Addendum)
Peter Rodriguez did see Dr. Yevette Edwards with spine surgery who did recommend microdiscectomy. At this point it is too expensive, we are going to proceed with a right-sided L5-S1 interlaminar epidural injection as he had no response to a right sided selective S1 nerve root block, and the disc does have both a far lateral and foraminal component.  I am going to leave it up to the interventional radiologist whether to consider converting this to a transforaminal but it should be done at the L5-S1 level. I'm also going to add a gabapentin up taper.

## 2013-01-11 NOTE — Progress Notes (Signed)
  Subjective:    CC: Follow up  HPI: Lumbar radiculitis with a herniated L5-S1 disc: When I last saw Ewald we initiate conservative measures with an epidural injection on the right that consisted of a selective right-sided S1 nerve root block. Unfortunately he had no response not even temporary. I sent him to Dr. Yevette Edwards for consideration of microdiscectomy. Dr. Yevette Edwards agreed, microdiscectomy would be the next best step however due to financial reasons the patient cannot yet proceed. Symptoms continue to be moderate and persistent, radiating down the right leg in an L5 versus an S1 distribution. Because of this he does desire to proceed with another epidural.  Past medical history, Surgical history, Family history not pertinant except as noted below, Social history, Allergies, and medications have been entered into the medical record, reviewed, and no changes needed.   Review of Systems: No fevers, chills, night sweats, weight loss, chest pain, or shortness of breath.   Objective:    General: Well Developed, well nourished, and in no acute distress.  Neuro: Alert and oriented x3, extra-ocular muscles intact, sensation grossly intact.  HEENT: Normocephalic, atraumatic, pupils equal round reactive to light, neck supple, no masses, no lymphadenopathy, thyroid nonpalpable.  Skin: Warm and dry, no rashes. Cardiac: Regular rate and rhythm, no murmurs rubs or gallops, no lower extremity edema.  Respiratory: Clear to auscultation bilaterally. Not using accessory muscles, speaking in full sentences.  Impression and Recommendations:

## 2013-01-20 DIAGNOSIS — Z0289 Encounter for other administrative examinations: Secondary | ICD-10-CM

## 2013-01-27 ENCOUNTER — Ambulatory Visit: Payer: 59 | Admitting: Sports Medicine

## 2013-02-08 ENCOUNTER — Ambulatory Visit (INDEPENDENT_AMBULATORY_CARE_PROVIDER_SITE_OTHER): Payer: 59 | Admitting: Sports Medicine

## 2013-02-08 ENCOUNTER — Encounter: Payer: Self-pay | Admitting: Sports Medicine

## 2013-02-08 VITALS — BP 145/82 | HR 83 | Ht 72.0 in | Wt 205.0 lb

## 2013-02-08 DIAGNOSIS — M5416 Radiculopathy, lumbar region: Secondary | ICD-10-CM

## 2013-02-08 DIAGNOSIS — IMO0002 Reserved for concepts with insufficient information to code with codable children: Secondary | ICD-10-CM

## 2013-02-08 NOTE — Progress Notes (Signed)
  Subjective:    CC: Followup  HPI: Peter Rodriguez has a fairly moderate sized right-sided L5-S1 disc protrusion. He is status post selective right-sided S1 nerve root block that provided no response, not even temporary. He has seen Dr. Yevette Edwardsumonski with spine surgery who recommended microdiscectomy, but his pain at this point is 2/10 most days, and 5/10 at the worst. He is amenable to trying another epidural, and he has not yet started his gabapentin. Pain is mild, persistent. It still radiates down the right leg in an L5 versus S1 distribution.  Past medical history, Surgical history, Family history not pertinant except as noted below, Social history, Allergies, and medications have been entered into the medical record, reviewed, and no changes needed.   Review of Systems: No fevers, chills, night sweats, weight loss, chest pain, or shortness of breath.   Objective:    General: Well Developed, well nourished, and in no acute distress.  Neuro: Alert and oriented x3, extra-ocular muscles intact, sensation grossly intact.  HEENT: Normocephalic, atraumatic, pupils equal round reactive to light, neck supple, no masses, no lymphadenopathy, thyroid nonpalpable.  Skin: Warm and dry, no rashes. Cardiac: Regular rate and rhythm, no murmurs rubs or gallops, no lower extremity edema.  Respiratory: Clear to auscultation bilaterally. Not using accessory muscles, speaking in full sentences.  Impression and Recommendations:

## 2013-02-08 NOTE — Assessment & Plan Note (Signed)
Unfortunately while had no response, not even temporarily to a right-sided selective S1 nerve root block. At this point we are going to proceed with an L5-S1 right-sided interlaminar epidural steroid injection. He will start gabapentin which he has not yet tried, he will continue Mobic as needed. At work he stand up workstation is being provided. He does tell me that he saw Dr. Yevette Edwardsumonski who did recommend microdiscectomy, the pain is not bad enough at this point for him to consider this, at the worst it is a 5/10, most days it is a 2/10  Like to see him back in about 2 weeks after the injection.

## 2013-02-10 ENCOUNTER — Ambulatory Visit
Admission: RE | Admit: 2013-02-10 | Discharge: 2013-02-10 | Disposition: A | Discharge status: Home / Self Care | Payer: 59 | Source: Ambulatory Visit | Attending: Sports Medicine | Admitting: Sports Medicine

## 2013-02-10 DIAGNOSIS — Z0289 Encounter for other administrative examinations: Secondary | ICD-10-CM

## 2013-02-10 MED ORDER — IOHEXOL 180 MG/ML  SOLN
1.0000 mL | Freq: Once | INTRAMUSCULAR | Status: AC | PRN
  Administered 2013-02-10: 1 mL via INTRA_ARTERIAL

## 2013-02-10 MED ORDER — METHYLPREDNISOLONE ACETATE 40 MG/ML INJ SUSP (RADIOLOG
120.0000 mg | Freq: Once | INTRAMUSCULAR | Status: AC
  Administered 2013-02-10: 120 mg via EPIDURAL

## 2013-02-10 NOTE — Discharge Instructions (Signed)

## 2013-02-16 ENCOUNTER — Telehealth: Payer: Self-pay

## 2013-02-16 NOTE — Telephone Encounter (Signed)
error 

## 2013-02-18 DIAGNOSIS — Z0289 Encounter for other administrative examinations: Secondary | ICD-10-CM

## 2013-02-28 ENCOUNTER — Ambulatory Visit: Payer: 59 | Admitting: Sports Medicine

## 2015-09-03 IMAGING — CR DG LUMBAR SPINE COMPLETE 4+V
5 series · 5 of 5 positions shown · non-contrast
Comparison: None.

CLINICAL DATA: 32-year-old male with low back injury and pain.

EXAM:
LUMBAR SPINE - COMPLETE 4+ VIEW

[view not recorded (1 of 5)]
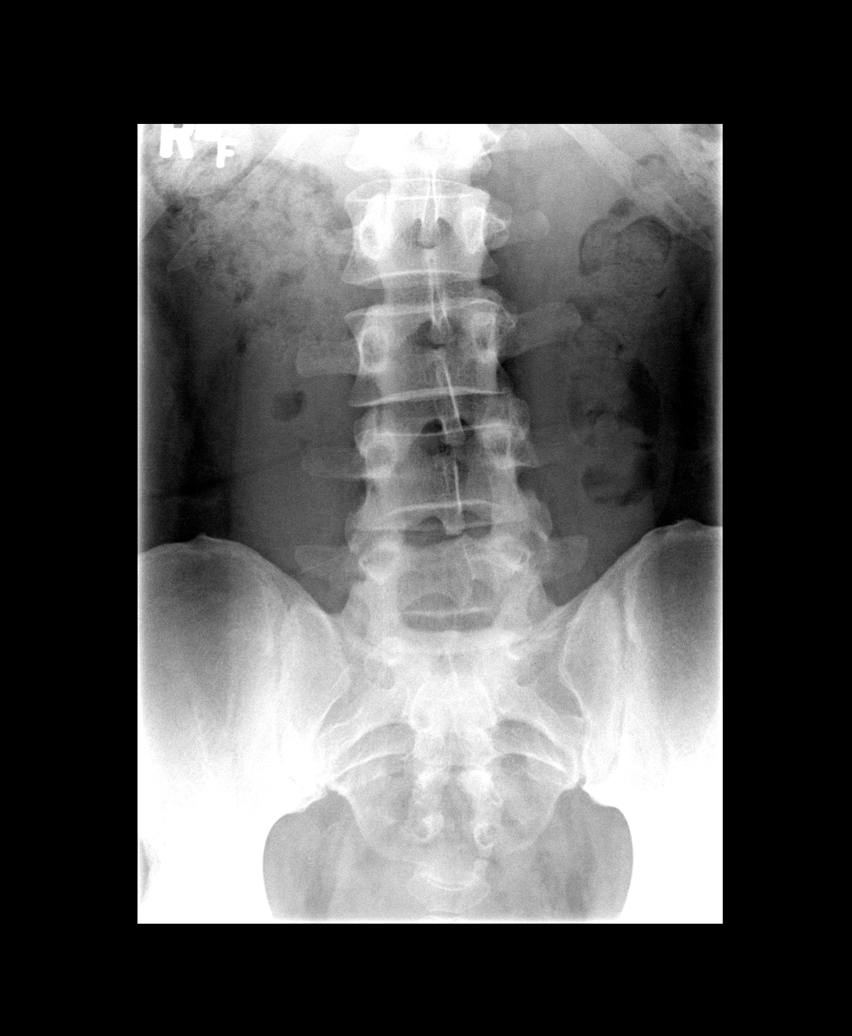

[view not recorded (2 of 5)]
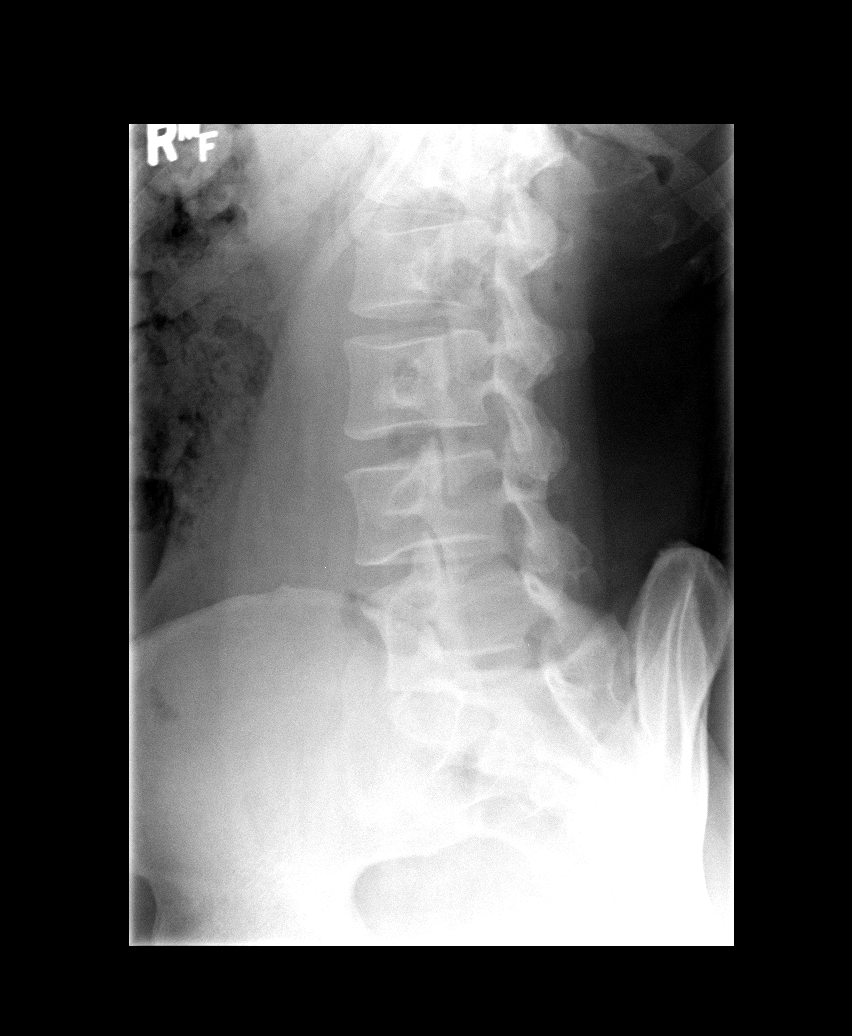

[view not recorded (3 of 5)]
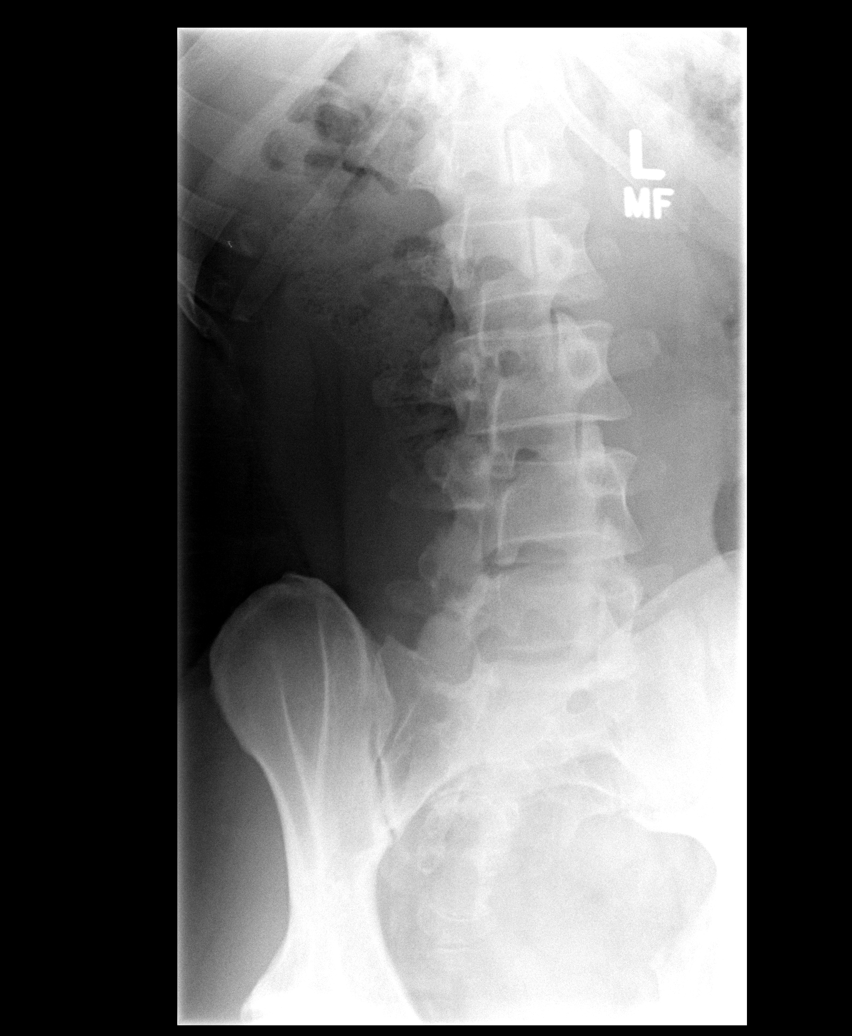

[view not recorded (4 of 5)]
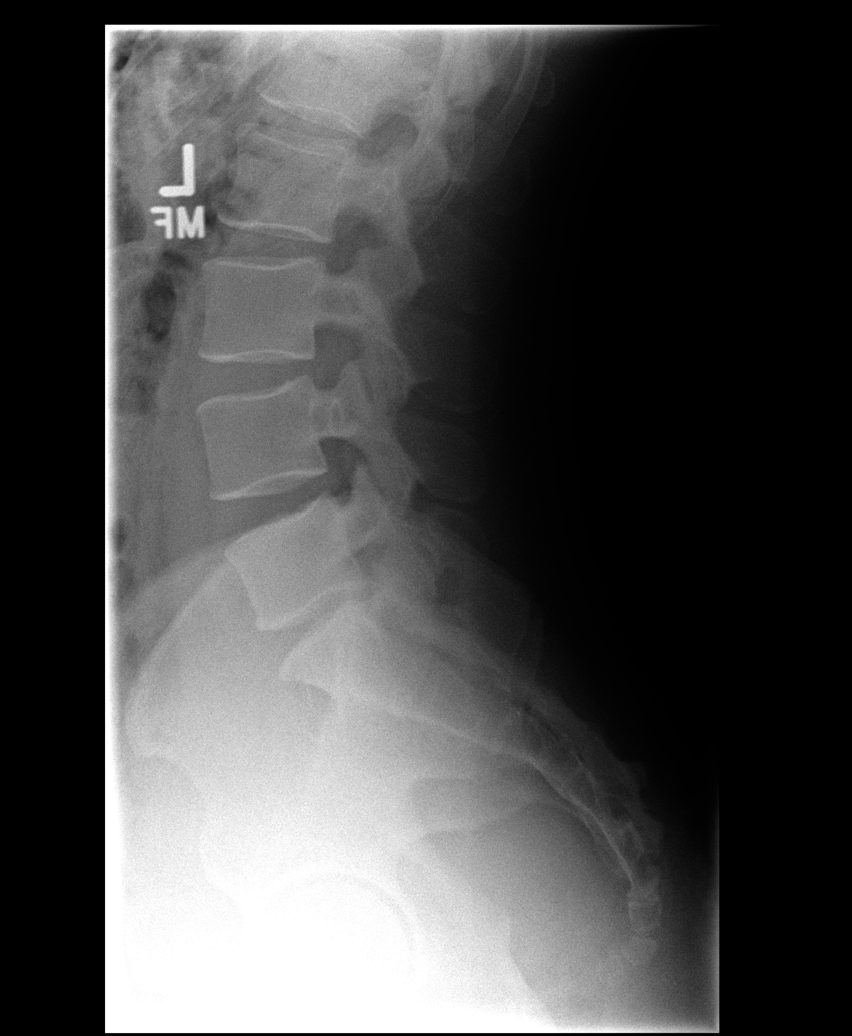

[view not recorded (5 of 5)]
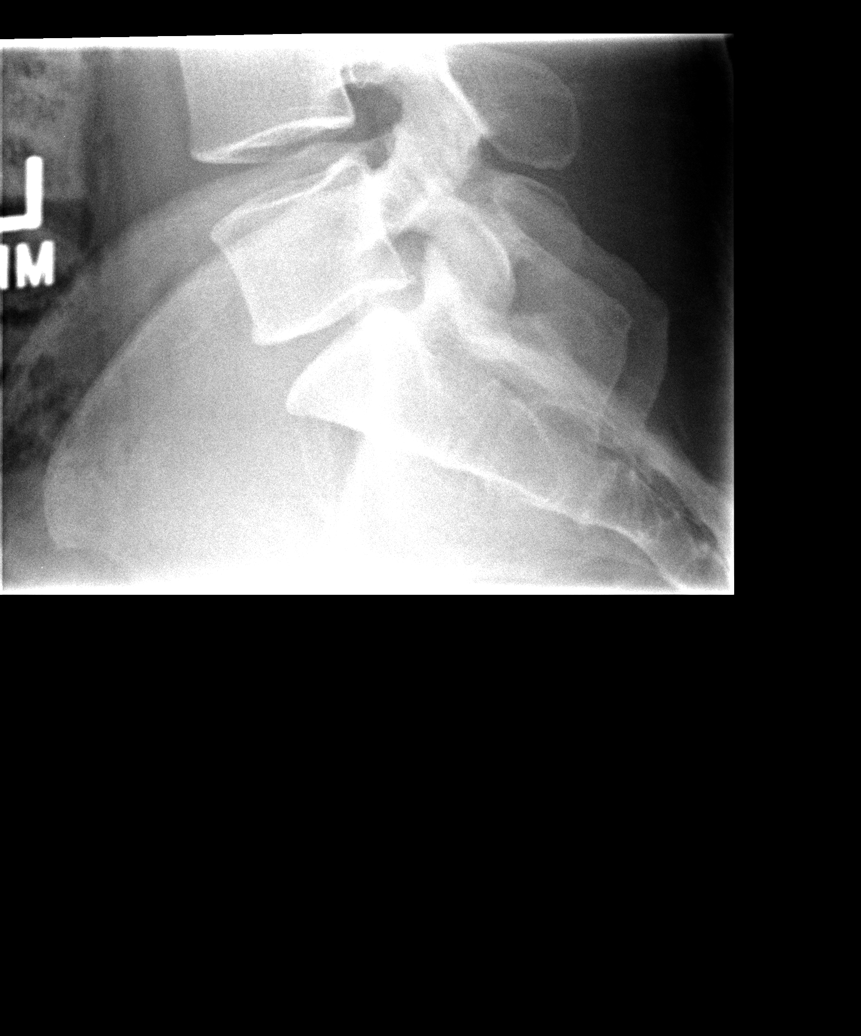

[5 of 5 positions shown; findings below may reference images not displayed]

FINDINGS: Five non rib-bearing lumbar type vertebra are identified.

5 mm retrolisthesis of L5 on S1 is noted with mild degenerative disc
disease at this level.

There is no evidence of acute fracture.

The other lumbar disk spaces are maintained.

No focal bony lesions or spondylolysis noted.
IMPRESSION: Mild degenerative disc disease and 5 mm spondylolisthesis at L5-S1.

No evidence of acute fracture.

## 2016-12-25 ENCOUNTER — Other Ambulatory Visit: Payer: Self-pay

## 2016-12-25 ENCOUNTER — Emergency Department (INDEPENDENT_AMBULATORY_CARE_PROVIDER_SITE_OTHER)
Admission: EM | Admit: 2016-12-25 | Discharge: 2016-12-25 | Disposition: A | Discharge status: Home / Self Care | Payer: 59 | Source: Home / Self Care | Attending: Family Medicine | Admitting: Family Medicine

## 2016-12-25 ENCOUNTER — Telehealth: Payer: Self-pay | Admitting: Emergency Medicine

## 2016-12-25 DIAGNOSIS — Z872 Personal history of diseases of the skin and subcutaneous tissue: Secondary | ICD-10-CM

## 2016-12-25 DIAGNOSIS — Z760 Encounter for issue of repeat prescription: Secondary | ICD-10-CM | POA: Diagnosis not present

## 2016-12-25 MED ORDER — BETAMETHASONE DIPROPIONATE 0.05 % EX OINT: TOPICAL_OINTMENT | Freq: Two times a day (BID) | CUTANEOUS | 0 refills | Status: DC

## 2016-12-25 MED ORDER — ALCLOMETASONE DIPROPIONATE 0.05 % EX CREA: TOPICAL_CREAM | Freq: Two times a day (BID) | CUTANEOUS | 0 refills | Status: DC

## 2016-12-25 NOTE — ED Provider Notes (Signed)
[facility]    CSN: [id number] Arrival date & time: 12/25/16  1448     History   Chief Complaint Chief Complaint  Patient presents with  . Rash    Eczema    HPI Peter ChickMario Hobin is a 36 y.o. male.   HPI Peter Rodriguez is a 36 y.o. male presenting to UC with request for refill on his alclometasone dpiro 0.05% cream for eczema. He has been using this medication about every other day on his eyebrows, beard and mustache for about 2 years w/o side effects. He cannot f/u with his dermatologist until February 2019. He notes if he stops using his medication for a few days his eczema becomes severe. No other concerns today.   History reviewed. No pertinent past medical history.  Patient Active Problem List   Diagnosis Date Noted  . Right lumbar radiculopathy 11/04/2012  . DYSURIA 08/06/2010  . GROIN PAIN 08/06/2010  . ESOPHAGEAL REFLUX 11/19/2009  . HEMATOCHEZIA 11/19/2009  . CANDIDIASIS, SKIN 01/11/2008  . DERMATITIS 01/11/2008  . RASH AND OTHER NONSPECIFIC SKIN ERUPTION 01/11/2008    History reviewed. No pertinent surgical history.     Home Medications    Prior to Admission medications   Medication Sig Start Date End Date Taking? Authorizing Provider  alclomethasone (ACLOVATE) 0.05 % cream Apply topically 2 (two) times daily. 12/25/16   Lurene ShadowPhelps, Andra Matsuo O, PA-C  cetirizine (ZYRTEC) 10 MG tablet Take 10 mg by mouth daily.    [provider]  gabapentin (NEURONTIN) 300 MG capsule One tab PO qHS for a week, then BID for a week, then TID. May double weekly to a max of 3,600mg /day 01/11/13   Monica Bectonhekkekandam, Thomas J, MD  meloxicam (MOBIC) 15 MG tablet One tab PO qAM with breakfast for 2 weeks, then daily prn pain. 11/04/12   Monica Bectonhekkekandam, Thomas J, MD    Family History History reviewed. No pertinent family history.  Social History Social History   Tobacco Use  . Smoking status: Never Smoker  . Smokeless tobacco: Never Used  Substance Use Topics  . Alcohol  use: Yes  . Drug use: No     Allergies   Patient has no known allergies.   Review of Systems Review of Systems  Skin: Negative for color change and rash.     Physical Exam Triage Vital Signs ED Triage Vitals [12/25/16 1515]  Enc Vitals Group     BP 135/87     Pulse Rate 79     Resp      Temp 98 F (36.7 C)     Temp Source Oral     SpO2 100 %     Weight 205 lb (93 kg)     Height 6' (1.829 m)     Head Circumference      Peak Flow      Pain Score 0     Pain Loc      Pain Edu?      Excl. in GC?    No data found.  Updated Vital Signs BP 135/87 (BP Location: Right Arm)   Pulse 79   Temp 98 F (36.7 C) (Oral)   Ht 6' (1.829 m)   Wt 205 lb (93 kg)   SpO2 100%   BMI 27.80 kg/m   Visual Acuity Right Eye Distance:   Left Eye Distance:   Bilateral Distance:    Right Eye Near:   Left Eye Near:    Bilateral Near:     Physical Exam  Constitutional: He appears well-developed and well-nourished. No distress.  HENT:  Head: Normocephalic and atraumatic.  Nose: Nose normal.  Eyes: EOM are normal. Right eye exhibits no discharge. Left eye exhibits no discharge.  Cardiovascular: Normal rate.  Pulmonary/Chest: Effort normal. No respiratory distress.  Skin: Skin is warm and dry. No rash noted. He is not diaphoretic. No erythema.  Nursing note and vitals reviewed.    UC Treatments / Results  Labs (all labs ordered are listed, but only abnormal results are displayed) Labs Reviewed - No data to display  EKG  EKG Interpretation None       Radiology No results found.  Procedures Procedures (including critical care time)  Medications Ordered in UC Medications - No data to display   Initial Impression / Assessment and Plan / UC Course  I have reviewed the triage vital signs and the nursing notes.  Pertinent labs & imaging results that were available during my care of the patient were reviewed by me and considered in my medical decision making (see chart  for details).     Medication refilled. Encouraged to keep f/u Dermatology appointment in February 2019.   Final Clinical Impressions(s) / UC Diagnoses   Final diagnoses:  Medication refill  History of eczema     Controlled Substance Prescriptions [place] Controlled Substance Registry consulted? Not Applicable   Rolla Platehelps, Elige Shouse O, PA-C 12/25/16 1723

## 2016-12-25 NOTE — ED Triage Notes (Signed)
Pt has exzema on his face and uses alclometasone Dipro 0.05%.  He is running low, and can not get in with his dermatologist.

## 2016-12-25 NOTE — Telephone Encounter (Signed)
Changed topical Dipro from betamethasone to alclometasone cream.  Pt notified of updated medication.  Pt will pick up new prescription.

## 2019-03-16 ENCOUNTER — Encounter: Payer: Self-pay | Admitting: Emergency Medicine

## 2019-03-16 ENCOUNTER — Ambulatory Visit
Admission: EM | Admit: 2019-03-16 | Discharge: 2019-03-16 | Disposition: A | Discharge status: Home / Self Care | Payer: 59 | Attending: Emergency Medicine | Admitting: Emergency Medicine

## 2019-03-16 ENCOUNTER — Other Ambulatory Visit: Payer: Self-pay

## 2019-03-16 DIAGNOSIS — N50812 Left testicular pain: Secondary | ICD-10-CM | POA: Diagnosis not present

## 2019-03-16 DIAGNOSIS — K4090 Unilateral inguinal hernia, without obstruction or gangrene, not specified as recurrent: Secondary | ICD-10-CM

## 2019-03-16 NOTE — ED Provider Notes (Signed)
[facility]    CSN: [id number] Arrival date & time: 03/16/19  1346      History   Chief Complaint Chief Complaint  Patient presents with  . Testicle Pain    HPI Peter Rodriguez is a 39 y.o. male presenting for 1 week course of left testicular discomfort.  States that this has been ongoing for months and typically is intermittent with occasional left lower abdominal/groin pain.  States it is worse when sitting or laying down.  Does not increase with Valsalva.  No testicular swelling, redness or discoloration.  Patient states that he has had years of paroxysmal episodes of discomfort at beginning of urination.  Unknown temporality, exacerbating factors.  Denies urinary frequency, urgency, weakened stream, hematuria, meatal irritation, anogenital lesions.  Not currently having these issues.  Patient denies history of UTI.  Does have remote history of STI (gonorrhea or chlamydia) in adolescence: Completed therapy at that time.  Currently sexually active with 1 male partner, not routinely using condoms.  Denies history of abdominal, pelvic or genital surgery.  No change in bowel habit.    History reviewed. No pertinent past medical history.  Patient Active Problem List   Diagnosis Date Noted  . Right lumbar radiculopathy 11/04/2012  . DYSURIA 08/06/2010  . GROIN PAIN 08/06/2010  . ESOPHAGEAL REFLUX 11/19/2009  . HEMATOCHEZIA 11/19/2009  . CANDIDIASIS, SKIN 01/11/2008  . DERMATITIS 01/11/2008  . RASH AND OTHER NONSPECIFIC SKIN ERUPTION 01/11/2008    History reviewed. No pertinent surgical history.     Home Medications    Prior to Admission medications   Medication Sig Start Date End Date Taking? Authorizing Provider  cetirizine (ZYRTEC) 10 MG tablet Take 10 mg by mouth daily.  03/16/19  [provider]  gabapentin (NEURONTIN) 300 MG capsule One tab PO qHS for a week, then BID for a week, then TID. May double weekly to a max of 3,600mg /day 01/11/13 03/16/19   Silverio Decamp, MD    Family History Family History  Family history unknown: Yes    Social History Social History   Tobacco Use  . Smoking status: Never Smoker  . Smokeless tobacco: Never Used  Substance Use Topics  . Alcohol use: Yes  . Drug use: No     Allergies   Patient has no known allergies.   Review of Systems As per HPI   Physical Exam Triage Vital Signs ED Triage Vitals  Enc Vitals Group     BP      Pulse      Resp      Temp      Temp src      SpO2      Weight      Height      Head Circumference      Peak Flow      Pain Score      Pain Loc      Pain Edu?      Excl. in [place]?    No data found.  Updated Vital Signs BP 126/74 (BP Location: Left Arm)   Pulse 95   Temp 98.8 F (37.1 C) (Oral)   Resp 14   SpO2 97%   Visual Acuity Right Eye Distance:   Left Eye Distance:   Bilateral Distance:    Right Eye Near:   Left Eye Near:    Bilateral Near:     Physical Exam Constitutional:      General: He is not in acute distress. HENT:  Head: Normocephalic and atraumatic.  Eyes:     General: No scleral icterus.    Pupils: Pupils are equal, round, and reactive to light.  Cardiovascular:     Rate and Rhythm: Normal rate.  Pulmonary:     Effort: Pulmonary effort is normal. No respiratory distress.     Breath sounds: No wheezing.  Genitourinary:    Penis: Normal.      Testes: Normal.     Comments: No testicular deformity, swelling, erythema or tenderness.  No tenderness of epididymis bilaterally.  Negative cremaster's reflex.  Left inguinal canal with mild bulging and tenderness when coughing Skin:    Coloration: Skin is not jaundiced or pale.     Findings: No bruising.  Neurological:     Mental Status: He is alert and oriented to person, place, and time.      UC Treatments / Results  Labs (all labs ordered are listed, but only abnormal results are displayed) Labs Reviewed  CYTOLOGY, (ORAL, ANAL, URETHRAL) ANCILLARY ONLY     EKG   Radiology No results found.  Procedures Procedures (including critical care time)  Medications Ordered in UC Medications - No data to display  Initial Impression / Assessment and Plan / UC Course  I have reviewed the triage vital signs and the nursing notes.  Pertinent labs & imaging results that were available during my care of the patient were reviewed by me and considered in my medical decision making (see chart for details).     Patient afebrile, nontoxic in office today.  GU exam significant for pain reproduction with Valsalva and appreciable bulge in inguinal canal: Otherwise unremarkable.  Likely inguinal hernia: Low concern for obstruction or gangrene at this time.  Negative cremaster's reflex: Low concern for torsion.  Cytology pending: We will treat if indicated.  Reviewed supportive measures, provided patient information on inguinal hernias as well as contact information for PCP office.  Return precautions discussed, patient verbalized understanding and is agreeable to plan. Final Clinical Impressions(s) / UC Diagnoses   Final diagnoses:  Unilateral inguinal hernia without obstruction or gangrene, recurrence not specified     Discharge Instructions     STD test pending: We will call if positive and send in prescriptions to the pharmacy. Likely hernia: Avoid heavy lifting, straining during bowel movements. May try pain with Tylenol, ibuprofen, heating pads. Important to establish care with PCP for further evaluation/management if needed and routine health screenings. Return for worsening pain, swelling of testicle, penile discharge, fever or abdominal pain.    ED Prescriptions    None     PDMP not reviewed this encounter.   Hall[place]Potvin, [place], [place] 03/16/19 1428

## 2019-03-16 NOTE — ED Notes (Signed)
Patient able to ambulate independently  

## 2019-03-16 NOTE — ED Triage Notes (Signed)
Pt presents to [facility] for assessment of "years" of incidents of discomfort with urination and painful ejaculation.  States it's not every time.  States he has had left testicular pain as well, but that has become more constant over the past week.

## 2019-03-16 NOTE — Discharge Instructions (Signed)
STD test pending: We will call if positive and send in prescriptions to the pharmacy. Likely hernia: Avoid heavy lifting, straining during bowel movements. May try pain with Tylenol, ibuprofen, heating pads. Important to establish care with PCP for further evaluation/management if needed and routine health screenings. Return for worsening pain, swelling of testicle, penile discharge, fever or abdominal pain.

## 2019-03-17 LAB — CYTOLOGY, (ORAL, ANAL, URETHRAL) ANCILLARY ONLY
Chlamydia: NEGATIVE
Neisseria Gonorrhea: NEGATIVE
Trichomonas: NEGATIVE

## 2020-11-14 ENCOUNTER — Emergency Department
Admission: EM | Admit: 2020-11-14 | Discharge: 2020-11-14 | Disposition: A | Discharge status: Home / Self Care | Payer: 59 | Source: Home / Self Care

## 2020-11-14 ENCOUNTER — Other Ambulatory Visit: Payer: Self-pay

## 2020-11-14 DIAGNOSIS — H6983 Other specified disorders of Eustachian tube, bilateral: Secondary | ICD-10-CM

## 2020-11-14 DIAGNOSIS — H9319 Tinnitus, unspecified ear: Secondary | ICD-10-CM

## 2020-11-14 MED ORDER — FLUTICASONE PROPIONATE 50 MCG/ACT NA SUSP: 2.0000 | Freq: Every day | NASAL | 0 refills | Status: AC

## 2020-11-14 NOTE — Discharge Instructions (Addendum)
Try the Flonase 2 times a day Drink plenty of water If this does not help with your ear problems then see an ear nose and throat doctor.  I have given you the number of [facility]

## 2020-11-14 NOTE — ED Provider Notes (Signed)
[facility]    CSN: [id number] Arrival date & time: 11/14/20  1002      History   Chief Complaint Chief Complaint  Patient presents with   Otalgia    Bilateral, with ear ringing    HPI Peter Rodriguez is a 40 y.o. male.   HPI  Patient states that he has had ringing in his ears for about a year.  No loud noise exposure in an [employer].  No firearms.  He said he did listen to the stereo loud when he was a teenager.  In the last 2 days he has had some pressure and pain in his ears.  He felt the may be clogged and tried to rinse them.  This did not help.  History reviewed. No pertinent past medical history.  Patient Active Problem List   Diagnosis Date Noted   Right lumbar radiculopathy 11/04/2012   DYSURIA 08/06/2010   GROIN PAIN 08/06/2010   ESOPHAGEAL REFLUX 11/19/2009   HEMATOCHEZIA 11/19/2009   CANDIDIASIS, SKIN 01/11/2008   DERMATITIS 01/11/2008   RASH AND OTHER NONSPECIFIC SKIN ERUPTION 01/11/2008    History reviewed. No pertinent surgical history.     Home Medications    Prior to Admission medications   Medication Sig Start Date End Date Taking? Authorizing Provider  fluticasone (FLONASE) 50 MCG/ACT nasal spray Place 2 sprays into both nostrils daily. 11/14/20  Yes Eustace Moore, MD  cetirizine (ZYRTEC) 10 MG tablet Take 10 mg by mouth daily.  03/16/19  [provider]  gabapentin (NEURONTIN) 300 MG capsule One tab PO qHS for a week, then BID for a week, then TID. May double weekly to a max of 3,600mg /day 01/11/13 03/16/19  Monica Becton, MD    Family History Family History  Family history unknown: Yes    Social History Social History   Tobacco Use   Smoking status: Never   Smokeless tobacco: Never  Substance Use Topics   Alcohol use: Yes   Drug use: No     Allergies   Patient has no known allergies.   Review of Systems Review of Systems See HPI  Physical Exam Triage Vital  Signs ED Triage Vitals  Enc Vitals Group     BP 11/14/20 1045 133/76     Pulse Rate 11/14/20 1045 80     Resp 11/14/20 1045 14     Temp 11/14/20 1045 98.4 F (36.9 C)     Temp Source 11/14/20 1045 Oral     SpO2 11/14/20 1045 100 %     Weight --      Height --      Head Circumference --      Peak Flow --      Pain Score 11/14/20 1047 1     Pain Loc --      Pain Edu? --      Excl. in [place]? --    No data found.  Updated Vital Signs BP 133/76 (BP Location: Left Arm)   Pulse 80   Temp 98.4 F (36.9 C) (Oral)   Resp 14   SpO2 100%     Physical Exam Constitutional:      General: He is not in acute distress.    Appearance: Normal appearance. He is well-developed and normal weight.  HENT:     Head: Normocephalic and atraumatic.     Right Ear: Ear canal and external ear normal.     Left Ear: Ear canal and external ear  normal.     Ears:     Comments: Both TMs have abnormal light reflex, slight bulging, no erythema or purulence noted    Nose: Nose normal. No congestion.     Mouth/Throat:     Mouth: Mucous membranes are moist.     Pharynx: No posterior oropharyngeal erythema.  Eyes:     Conjunctiva/sclera: Conjunctivae normal.     Pupils: Pupils are equal, round, and reactive to light.  Cardiovascular:     Rate and Rhythm: Normal rate.  Pulmonary:     Effort: Pulmonary effort is normal. No respiratory distress.  Abdominal:     General: There is no distension.     Palpations: Abdomen is soft.  Musculoskeletal:        General: Normal range of motion.     Cervical back: Normal range of motion.  Lymphadenopathy:     Cervical: No cervical adenopathy.  Skin:    General: Skin is warm and dry.  Neurological:     Mental Status: He is alert.     UC Treatments / Results  Labs (all labs ordered are listed, but only abnormal results are displayed) Labs Reviewed - No data to display  EKG   Radiology No results found.  Procedures Procedures (including critical care  time)  Medications Ordered in UC Medications - No data to display  Initial Impression / Assessment and Plan / UC Course  I have reviewed the triage vital signs and the nursing notes.  Pertinent labs & imaging results that were available during my care of the patient were reviewed by me and considered in my medical decision making (see chart for details).     I explained that he may have some eustachian tube dysfunction that is causing his ear pain and popping.  This is not causing the tinnitus, likely.  I told him that her tinnitus work-up was more than we could do at her primary care office, he needs to go see an ear nose and throat specialist. Final Clinical Impressions(s) / UC Diagnoses   Final diagnoses:  Eustachian tube dysfunction, bilateral  Tinnitus, objective, unspecified laterality     Discharge Instructions      Try the Flonase 2 times a day Drink plenty of water If this does not help with your ear problems then see an ear nose and throat doctor.  I have given you the number of [facility]   ED Prescriptions     Medication Sig Dispense Auth. Provider   fluticasone (FLONASE) 50 MCG/ACT nasal spray Place 2 sprays into both nostrils daily. 16 g Eustace Moore, MD      PDMP not reviewed this encounter.   Eustace Moore, MD 11/14/20 [phone]

## 2020-11-14 NOTE — ED Triage Notes (Signed)
Pt presents with intermittent ear ringing x1 year. Pt states bilateral ears became painful when he tried to lavage them at home
